# Patient Record
Sex: Male | Born: 1950 | Race: White | Hispanic: No | Marital: Single
Health system: Southern US, Community
[De-identification: ages and names within clinical notes are randomized; demographics above are authoritative.]

---

## 2008-10-29 ENCOUNTER — Ambulatory Visit: Payer: Self-pay | Admitting: Family Medicine

## 2008-10-29 DIAGNOSIS — I10 Essential (primary) hypertension: Secondary | ICD-10-CM

## 2008-10-30 ENCOUNTER — Telehealth (INDEPENDENT_AMBULATORY_CARE_PROVIDER_SITE_OTHER): Payer: Self-pay | Admitting: *Deleted

## 2008-10-30 DIAGNOSIS — E119 Type 2 diabetes mellitus without complications: Secondary | ICD-10-CM

## 2008-10-30 LAB — CONVERTED CEMR LAB
Albumin: 4.5 g/dL (ref 3.5–5.2)
BUN: 11 mg/dL (ref 6–23)
CO2: 24 meq/L (ref 19–32)
Calcium: 9.3 mg/dL (ref 8.4–10.5)
Chloride: 97 meq/L (ref 96–112)
Glucose, Bld: 353 mg/dL — ABNORMAL HIGH (ref 70–99)
HDL: 38 mg/dL — ABNORMAL LOW (ref >39)
Lymphs Abs: 1.5 10*3/uL (ref 0.7–4.0)
MCV: 85.5 fL (ref 78.0–100.0)
Monocytes Relative: 7 % (ref 3–12)
Neutro Abs: 4.1 10*3/uL (ref 1.7–7.7)
Neutrophils Relative %: 66 % (ref 43–77)
Potassium: 4.3 meq/L (ref 3.5–5.3)
RBC: 5.71 M/uL (ref 4.22–5.81)
TSH: 1.778 microintl units/mL (ref 0.350–4.50)
Triglycerides: 165 mg/dL — ABNORMAL HIGH (ref <150)
WBC: 6.3 10*3/uL (ref 4.0–10.5)

## 2008-11-05 ENCOUNTER — Encounter (INDEPENDENT_AMBULATORY_CARE_PROVIDER_SITE_OTHER): Payer: Self-pay | Admitting: Family Medicine

## 2008-11-05 ENCOUNTER — Ambulatory Visit: Payer: Self-pay | Admitting: Family Medicine

## 2008-11-11 ENCOUNTER — Ambulatory Visit: Payer: Self-pay | Admitting: *Deleted

## 2008-11-15 ENCOUNTER — Encounter (INDEPENDENT_AMBULATORY_CARE_PROVIDER_SITE_OTHER): Payer: Self-pay | Admitting: Family Medicine

## 2008-11-21 ENCOUNTER — Ambulatory Visit: Payer: Self-pay | Admitting: Internal Medicine

## 2008-12-09 ENCOUNTER — Telehealth (INDEPENDENT_AMBULATORY_CARE_PROVIDER_SITE_OTHER): Payer: Self-pay | Admitting: Family Medicine

## 2008-12-16 ENCOUNTER — Ambulatory Visit: Payer: Self-pay | Admitting: Family Medicine

## 2008-12-16 LAB — CONVERTED CEMR LAB: Hgb A1c MFr Bld: 8.7 %

## 2008-12-30 ENCOUNTER — Ambulatory Visit: Payer: Self-pay | Admitting: Family Medicine

## 2008-12-30 DIAGNOSIS — E113299 Type 2 diabetes mellitus with mild nonproliferative diabetic retinopathy without macular edema, unspecified eye: Secondary | ICD-10-CM | POA: Insufficient documentation

## 2008-12-30 DIAGNOSIS — E11319 Type 2 diabetes mellitus with unspecified diabetic retinopathy without macular edema: Secondary | ICD-10-CM | POA: Insufficient documentation

## 2008-12-30 DIAGNOSIS — E11311 Type 2 diabetes mellitus with unspecified diabetic retinopathy with macular edema: Secondary | ICD-10-CM | POA: Insufficient documentation

## 2008-12-30 LAB — CONVERTED CEMR LAB
Bilirubin Urine: NEGATIVE
Blood Glucose, Fingerstick: 138
Cholesterol: 187 mg/dL (ref 0–200)
Ketones, urine, test strip: NEGATIVE
Nitrite: NEGATIVE
Total CHOL/HDL Ratio: 5.2
pH: 5

## 2009-01-01 ENCOUNTER — Encounter (INDEPENDENT_AMBULATORY_CARE_PROVIDER_SITE_OTHER): Payer: Self-pay | Admitting: Family Medicine

## 2009-01-06 ENCOUNTER — Encounter (INDEPENDENT_AMBULATORY_CARE_PROVIDER_SITE_OTHER): Payer: Self-pay | Admitting: Family Medicine

## 2009-01-10 ENCOUNTER — Encounter (INDEPENDENT_AMBULATORY_CARE_PROVIDER_SITE_OTHER): Payer: Self-pay | Admitting: Family Medicine

## 2014-12-08 ENCOUNTER — Encounter (HOSPITAL_COMMUNITY): Payer: Self-pay | Admitting: *Deleted

## 2014-12-08 ENCOUNTER — Emergency Department (HOSPITAL_COMMUNITY): Payer: Self-pay

## 2014-12-08 ENCOUNTER — Emergency Department (HOSPITAL_COMMUNITY)
Admission: EM | Admit: 2014-12-08 | Discharge: 2015-01-05 | Disposition: E | Payer: Self-pay | Attending: Emergency Medicine | Admitting: Emergency Medicine

## 2014-12-08 DIAGNOSIS — I469 Cardiac arrest, cause unspecified: Secondary | ICD-10-CM

## 2014-12-08 DIAGNOSIS — Z880 Allergy status to penicillin: Secondary | ICD-10-CM | POA: Insufficient documentation

## 2014-12-08 DIAGNOSIS — R140 Abdominal distension (gaseous): Secondary | ICD-10-CM | POA: Insufficient documentation

## 2014-12-08 LAB — I-STAT TROPONIN, ED: Troponin i, poc: 0.32 ng/mL (ref 0.00–0.08)

## 2014-12-08 LAB — CBC WITH DIFFERENTIAL/PLATELET
Basophils Absolute: 0.1 10*3/uL (ref 0.0–0.1)
Basophils Relative: 1 % (ref 0–1)
EOS PCT: 1 % (ref 0–5)
Eosinophils Absolute: 0.1 10*3/uL (ref 0.0–0.7)
HCT: 39.9 % (ref 39.0–52.0)
HEMOGLOBIN: 12.7 g/dL — AB (ref 13.0–17.0)
Lymphocytes Relative: 33 % (ref 12–46)
Lymphs Abs: 3.5 10*3/uL (ref 0.7–4.0)
MCH: 29.1 pg (ref 26.0–34.0)
MCHC: 31.8 g/dL (ref 30.0–36.0)
MCV: 91.3 fL (ref 78.0–100.0)
MONOS PCT: 7 % (ref 3–12)
Monocytes Absolute: 0.8 10*3/uL (ref 0.1–1.0)
Neutro Abs: 6.2 10*3/uL (ref 1.7–7.7)
Neutrophils Relative %: 58 % (ref 43–77)
Platelets: 109 10*3/uL — ABNORMAL LOW (ref 150–400)
RBC: 4.37 MIL/uL (ref 4.22–5.81)
RDW: 13.6 % (ref 11.5–15.5)
WBC: 10.7 10*3/uL — ABNORMAL HIGH (ref 4.0–10.5)

## 2014-12-08 LAB — I-STAT ARTERIAL BLOOD GAS, ED
ACID-BASE DEFICIT: 19 mmol/L — AB (ref 0.0–2.0)
Bicarbonate: 13 mEq/L — ABNORMAL LOW (ref 20.0–24.0)
O2 SAT: 96 %
PH ART: 6.954 — AB (ref 7.350–7.450)
Patient temperature: 35.8
TCO2: 15 mmol/L (ref 0–100)
pCO2 arterial: 57.5 mmHg (ref 35.0–45.0)
pO2, Arterial: 129 mmHg — ABNORMAL HIGH (ref 80.0–100.0)

## 2014-12-08 LAB — I-STAT CG4 LACTIC ACID, ED: Lactic Acid, Venous: 12.69 mmol/L (ref 0.5–2.0)

## 2014-12-08 LAB — I-STAT CHEM 8, ED
BUN: 35 mg/dL — ABNORMAL HIGH (ref 6–23)
CALCIUM ION: 1.15 mmol/L (ref 1.13–1.30)
Chloride: 103 mmol/L (ref 96–112)
Creatinine, Ser: 2.4 mg/dL — ABNORMAL HIGH (ref 0.50–1.35)
GLUCOSE: 494 mg/dL — AB (ref 70–99)
HCT: 42 % (ref 39.0–52.0)
Hemoglobin: 14.3 g/dL (ref 13.0–17.0)
Potassium: 4.1 mmol/L (ref 3.5–5.1)
Sodium: 137 mmol/L (ref 135–145)
TCO2: 13 mmol/L (ref 0–100)

## 2014-12-08 MED ORDER — EPINEPHRINE HCL 1 MG/ML IJ SOLN
0.5000 ug/min | INTRAVENOUS | Status: DC
  Administered 2014-12-08: 0.5 ug/min via INTRAVENOUS
  Filled 2014-12-08: qty 4

## 2014-12-08 MED ORDER — ROCURONIUM BROMIDE 50 MG/5ML IV SOLN
INTRAVENOUS | Status: AC
  Filled 2014-12-08: qty 2

## 2014-12-08 MED ORDER — SUCCINYLCHOLINE CHLORIDE 20 MG/ML IJ SOLN
INTRAMUSCULAR | Status: AC
  Filled 2014-12-08: qty 1

## 2014-12-08 MED ORDER — ETOMIDATE 2 MG/ML IV SOLN
INTRAVENOUS | Status: AC
  Filled 2014-12-08: qty 20

## 2014-12-08 MED ORDER — EPINEPHRINE HCL 0.1 MG/ML IJ SOSY
PREFILLED_SYRINGE | INTRAMUSCULAR | Status: DC | PRN
Start: 2014-12-08 — End: 2014-12-09
  Administered 2014-12-08 (×2): 1 via INTRAVENOUS
  Administered 2014-12-08: 0.5 mg via INTRAVENOUS

## 2014-12-08 MED ORDER — LIDOCAINE HCL (CARDIAC) 20 MG/ML IV SOLN
INTRAVENOUS | Status: AC
  Filled 2014-12-08: qty 5

## 2014-12-08 MED ORDER — SODIUM BICARBONATE 8.4 % IV SOLN
INTRAVENOUS | Status: DC | PRN
  Administered 2014-12-08: 50 meq via INTRAVENOUS

## 2014-12-08 MED ORDER — ROCURONIUM BROMIDE 50 MG/5ML IV SOLN
INTRAVENOUS | Status: DC | PRN
Start: 2014-12-08 — End: 2014-12-09
  Administered 2014-12-08: 100 mg via INTRAVENOUS

## 2014-12-08 NOTE — ED Notes (Signed)
Pulse check - no pulses. CPR restarted.

## 2014-12-08 NOTE — ED Notes (Signed)
Pulse check- pulses returned 

## 2014-12-08 NOTE — Code Documentation (Signed)
Pulses lost. 

## 2014-12-08 NOTE — ED Notes (Signed)
Dr Wilkie AyeHorton given a copy of troponin results .32

## 2014-12-08 NOTE — ED Notes (Signed)
Pulse check - pulses have returned.

## 2014-12-08 NOTE — ED Notes (Signed)
Cooling stopped her Dr Eliberto IvoryAustin (critical care).

## 2014-12-08 NOTE — ED Notes (Signed)
Dr Wilkie AyeHorton given a copy of lactic acid result 12.69

## 2014-12-08 NOTE — ED Notes (Signed)
Pt arrives EMS from parking lot. Pt was found by bystander. EMS found pt to be pulseless (asystole) with apneic breathing. EMS began CPR. Pt received total of 8 epis and 1 amp D50. Pulses returned and pt was SVT rate 160. Pt became pulseless again en route and CPR was restarted. 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 Cold saline initiated by EMS.

## 2014-12-08 NOTE — ED Provider Notes (Signed)
INTUBATION Performed by: Alexander LeitzNickle, Alexander Reed  Required items: required blood products, implants, devices, and special equipment available Patient identity confirmed: provided demographic data and hospital-assigned identification number Time out: Not called due to emergent procedure  Indications: respiratory failure, cardiac arrest  Intubation method: Direct Laryngoscopy, Mac 4  Preoxygenation: Alexander DareKing Airway  Sedatives: none - GCS 3, in cardiac arrest Paralytic: Roccuronium  Tube Size: 7.5 cuffed  Post-procedure assessment: chest rise and ETCO2 monitor Breath sounds: equal and absent over the epigastrium Tube secured with: ETT holder Chest x-ray interpreted by radiologist and me.  Chest x-ray findings: endotracheal tube in appropriate position  Patient tolerated the procedure well with no immediate complications.   Alexander LeitzAlex Leathia Farnell, MD 26-Oct-2014 [id number]  Alexander Batonourtney F Horton, MD 12/12/2014 [phone]

## 2014-12-08 NOTE — Procedures (Deleted)
INTUBATION Performed by: Dorna LeitzNickle, Dovie Kapusta  Required items: required blood products, implants, devices, and special equipment available Patient identity confirmed: provided demographic data and hospital-assigned identification number Time out: Not called due to emergent procedure  Indications: respiratory failure, cardiac arrest  Intubation method: Direct Laryngoscopy, Mac 4  Preoxygenation: Brooke DareKing Airway  Sedatives: none - GCS 3, in cardiac arrest Paralytic: Roccuronium  Tube Size: 7.5 cuffed  Post-procedure assessment: chest rise and ETCO2 monitor Breath sounds: equal and absent over the epigastrium Tube secured with: ETT holder Chest x-ray interpreted by radiologist and me.  Chest x-ray findings: endotracheal tube in appropriate position  Patient tolerated the procedure well with no immediate complications.

## 2014-12-08 NOTE — Consult Note (Signed)
PULMONARY  / CRITICAL CARE MEDICINE CONSULTATION   Name: Alexander Reed MRN: [id number] DOB: [birth year]    ADMISSION DATE:  Dec 09, 2014 CONSULTATION DATE: 01/03/2015  REQUESTING CLINICIAN: Dr. Durward Fortes PRIMARY SERVICE: E\D  CHIEF COMPLAINT:  Cardiac Arrest  BRIEF PATIENT DESCRIPTION: 63yom with PMH of DM, presents after being found down in the field.  ROSC after about 25 min.  Initial rhythm PEA.    SIGNIFICANT EVENTS / STUDIES:  December 09, 2014:  Presented to ED after arrest in field.  Arrested again x 2 in ED with ROSC, CXR with diffuse bilateral infiltrates  LINES / TUBES: ETT:  09-Dec-2014--> Foley:  12/22/2014-->  CULTURES: None  ANTIBIOTICS: None  HISTORY OF PRESENT ILLNESS:  63yom with PMH of DM, presents after being found down in the field.  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  Unclear how long he had been down.  CPR was initiated in the field with ROSC after about 25 min and 8 rounds of epi.  Pt coded again in the ED with ROSC after 2 rounds epi.  Cooling was initiated but d/c'ed per me given long down time.  Pt also with difficulty oxygenating and recurrent hypotension requiring epi gtt.  I contacted his brother via phone and updated him.  I expressed my concerns about his neuro status given unknown down time and persistent instability.  His brother stated that the pt would not want aggressive measures per prior expressed wishes.  Therefore, pt was made DNR and then terminally extubated per his family wishes.    PAST MEDICAL HISTORY :  History reviewed. No pertinent past medical history. History reviewed. No pertinent past surgical history. Prior to Admission medications   Not on File   Allergies  Allergen Reactions  . Penicillins     FAMILY HISTORY:  Unable to obtain 2/2 AMS  SOCIAL HISTORY: No h/o smoking  REVIEW OF SYSTEMS: Unable to obtain 2/2 AMS  SUBJECTIVE:  Unable to obtain 2/2 AMS  VITAL SIGNS: Temp:  [95.2 F (35.1 C)-98.4 F (36.9 C)] 98.4  F (36.9 C) (04/03 2352) Pulse Rate:  [95-155] 101 (04/03 2352) Resp:  [12-24] 22 (04/03 2352) BP: (67-182)/(44-131) 71/48 mmHg (04/03 2352) SpO2:  [77 %-95 %] 92 % (04/03 2352) FiO2 (%):  [100 %] 100 % (04/03 2317) HEMODYNAMICS:   VENTILATOR SETTINGS: Vent Mode:  [-]  FiO2 (%):  [100 %] 100 % INTAKE / OUTPUT: Intake/Output      04/03 0701 - 04/04 0700   Urine 200   Total Output 200   Net -200         PHYSICAL EXAMINATION: General:  Intubated, nonresponsive Neuro:  Pupils fixed, dilated, arreflexic HEENT:  ETT in place Cardiovascular:  Tachy, no murmurs Lungs:  Diffusely wheezy and coarse, poor air movement Abdomen:  Absent BS Musculoskeletal:  No edema Skin:  Cool extremities  LABS:  CBC  Recent Labs Lab 12/07/2014 2309 12/07/2014 2319  WBC 10.7*  --   HGB 12.7* 14.3  HCT 39.9 42.0  PLT 109*  --    Coag's No results for input(s): APTT, INR in the last 168 hours. BMET  Recent Labs Lab 12/17/2014 2319  NA 137  K 4.1  CL 103  BUN 35*  CREATININE 2.40*  GLUCOSE 494*   Electrolytes No results for input(s): CALCIUM, MG, PHOS in the last 168 hours. Sepsis Markers  Recent Labs Lab 12/17/2014 2321  LATICACIDVEN 12.69*   ABG  Recent Labs Lab 12/19/2014 2345  PHART 6.954*  PCO2ART 57.5*  PO2ART 129.0*   Liver Enzymes No results for input(s): AST, ALT, ALKPHOS, BILITOT, ALBUMIN in the last 168 hours. Cardiac Enzymes No results for input(s): TROPONINI, PROBNP in the last 168 hours. Glucose No results for input(s): GLUCAP in the last 168 hours.  Imaging No results found.  EKG: Tachy, afib, RBB, no ST elevation/ depression CXR: Diffuse bilateral patchy infiltrates  ASSESSMENT / PLAN:  63yom with PMH of DM, presents after being found down in the field with prolonged CPR.  Neuro exam was concerning and pH was <7.00 with mixed metabolic and resp acidosis.  While in the ED, pt was requiring intermittent epi boluses to maintain BP.  Per discussion with  family both via phone and at bedside, decision was made to make pt comfort measures only.  He was terminally extubated in the ED.    TODAY'S SUMMARY: Presented to ED after being found down in PEA in the field.  >30 total CPR.  Concerning neuro exam.  Made comfort measure by family and terminally extubated in ED.    I have personally obtained a history, examined the patient, evaluated laboratory and imaging results, formulated the assessment and plan and placed orders.  CRITICAL CARE: The patient is critically ill with multiple organ systems failure and requires high complexity decision making for assessment and support, frequent evaluation and titration of therapies, application of advanced monitoring technologies and extensive interpretation of multiple databases. Critical Care Time devoted to patient care services described in this note is 50 minutes.   Joen Laura. Adrian Kavian Peters, MD Pulmonary and Critical Care Medicine [facility] Pager: [phone]   12/29/2014, 11:58 PM

## 2014-12-08 NOTE — ED Notes (Signed)
Cold Saline initiated.

## 2014-12-08 NOTE — ED Notes (Signed)
Dr Eliberto IvoryAustin (critical care) at bedside.

## 2014-12-09 ENCOUNTER — Telehealth: Payer: Self-pay

## 2014-12-09 LAB — SAMPLE TO BLOOD BANK

## 2014-12-09 LAB — COMPREHENSIVE METABOLIC PANEL
ALK PHOS: 102 U/L (ref 39–117)
ALT: 272 U/L — ABNORMAL HIGH (ref 0–53)
AST: 268 U/L — AB (ref 0–37)
Albumin: 3.3 g/dL — ABNORMAL LOW (ref 3.5–5.2)
Anion gap: 21 — ABNORMAL HIGH (ref 5–15)
BILIRUBIN TOTAL: 0.4 mg/dL (ref 0.3–1.2)
BUN: 27 mg/dL — ABNORMAL HIGH (ref 6–23)
CHLORIDE: 101 mmol/L (ref 96–112)
CO2: 14 mmol/L — AB (ref 19–32)
CREATININE: 2.92 mg/dL — AB (ref 0.50–1.35)
Calcium: 7.9 mg/dL — ABNORMAL LOW (ref 8.4–10.5)
GFR calc Af Amer: 25 mL/min — ABNORMAL LOW (ref >90)
GFR calc non Af Amer: 21 mL/min — ABNORMAL LOW (ref >90)
Glucose, Bld: 504 mg/dL — ABNORMAL HIGH (ref 70–99)
Potassium: 3.9 mmol/L (ref 3.5–5.1)
Sodium: 136 mmol/L (ref 135–145)
Total Protein: 5.8 g/dL — ABNORMAL LOW (ref 6.0–8.3)

## 2014-12-09 LAB — URINALYSIS, ROUTINE W REFLEX MICROSCOPIC
BILIRUBIN URINE: NEGATIVE
Glucose, UA: 100 mg/dL — AB
Ketones, ur: NEGATIVE mg/dL
LEUKOCYTES UA: NEGATIVE
NITRITE: NEGATIVE
Protein, ur: 100 mg/dL — AB
SPECIFIC GRAVITY, URINE: 1.02 (ref 1.005–1.030)
UROBILINOGEN UA: 0.2 mg/dL (ref 0.0–1.0)
pH: 5 (ref 5.0–8.0)

## 2014-12-09 LAB — URINE MICROSCOPIC-ADD ON

## 2014-12-09 MED FILL — Medication: Qty: 1 | Status: AC

## 2015-01-05 NOTE — ED Notes (Addendum)
Pt in asystole, skin pale, dry, no respirations noted. Brother at bedside. EDP informed and Dr. Eliberto IvoryAustin paged.

## 2015-01-05 NOTE — Procedures (Signed)
Extubation Procedure Note  Patient Details:   Name: Lurlean LeydenDavid Orbach DOB: 1951-07-29 MRN: [id number]   Airway Documentation:     Evaluation  O2 sats: transiently fell during during procedure Complications: No apparent complications Patient did tolerate procedure well. Bilateral Breath Sounds: Coarse crackles Suctioning: Airway, Oral No  Patient terminally extubated to room air. No respiratory effort noted post Extubation.   Erskine SpeedMike Jr, [place]ondo Holycross William 12/26/2014, 12:47 AM

## 2015-01-05 NOTE — ED Provider Notes (Signed)
CSN: [id number]     Arrival date & time 12/11/2014  2305 History   First MD Initiated Contact with Patient 12/14/2014 2327     No chief complaint on file.    (Consider location/radiation/quality/duration/timing/severity/associated sxs/prior Treatment) HPI  Level 5 Caveat  This is a 64 year old male who presents in cardiac arrest. Per EMS, patient was found down pulseless. Initial rhythm was asystole. CPR was initiated. Patient was given 80 g of epinephrine and EMS achieved Rosc.  However, approximately 5 minutes prior to arrival, they lost pulses again. CPR in progress upon arrival. Per EMS, patient was in a tachyarrhythmia with ROSC.  Only known history of diabetes. Patient given D50 and glucose 450.  CPR was continued in the trauma Bay. Patient was given 1 mg of epinephrine. King airway in place and oxygenating in the low 90s.  Achieved ROS see after 1 mg of epinephrine. Patient appears to be an atrial flutter rhythm on the monitor. King airway was replaced with an ET tube. Patient was intubated without sedative but was given paralytic given stiffness of his jaw.  Patient lost pulses again and after approximately 2 minutes of CPR and 1 mg of epinephrine, patient regained pulses. Epinephrine drip initiated.  Patient cooled per protocol.  History reviewed. No pertinent past medical history. History reviewed. No pertinent past surgical history. No family history on file. History  Substance Use Topics  . Smoking status: Not on file  . Smokeless tobacco: Not on file  . Alcohol Use: Not on file    Review of Systems  Unable to perform ROS: Acuity of condition      Allergies  Penicillins  Home Medications   Prior to Admission medications   Not on File   BP 135/64 mmHg  Pulse 129  Temp(Src) 98.6 F (37 C)  Resp 22  Ht  (1.803 m)  SpO2 97% Physical Exam  Constitutional:  GCS 3, King airway in place  HENT:  Head: Normocephalic and atraumatic.  Eyes:  Pupils 6 mm  nonreactive bilaterally  Neck: Neck supple.  Cardiovascular:  Pulseless, CPR in progress  Pulmonary/Chest:  Coarse bilateral breath sounds with bagging, no spontaneous respiratory effort  Abdominal: Soft. He exhibits distension.  Musculoskeletal: He exhibits no edema.  Neurological:  GCS 3  Skin: Skin is warm and dry.  Nursing note and vitals reviewed.   ED Course  Procedures (including critical care time)  CRITICAL CARE Performed by: Shon Baton   Total critical care time: 45 min  Critical care time was exclusive of separately billable procedures and treating other patients.  Critical care was necessary to treat or prevent imminent or life-threatening deterioration.  Critical care was time spent personally by me on the following activities: development of treatment plan with patient and/or surrogate as well as nursing, discussions with consultants, evaluation of patient's response to treatment, examination of patient, obtaining history from patient or surrogate, ordering and performing treatments and interventions, ordering and review of laboratory studies, ordering and review of radiographic studies, pulse oximetry and re-evaluation of patient's condition.  Cardiopulmonary Resuscitation (CPR) Procedure Note Directed/Performed by: Shon Baton I personally directed ancillary staff and/or performed CPR in an effort to regain return of spontaneous circulation and to maintain cardiac, neuro and systemic perfusion.   INTUBATION Performed by: Resident, supervised by Shon Baton  Required items: required blood products, implants, devices, and special equipment available Patient identity confirmed: provided demographic data and hospital-assigned identification number Time out: Immediately prior to procedure a "  time out" was called to verify the correct patient, procedure, equipment, support staff and site/side marked as required.  Indications: Cardiac  arrest  Intubation method: Direct laryngoscopy   Preoxygenation: Brooke DareKing  Sedatives: none Paralytic: Rocuronium  Tube Size: 8-0 cuffed  Post-procedure assessment: chest rise and ETCO2 monitor Breath sounds: equal and absent over the epigastrium Tube secured with: ETT holder Chest x-ray interpreted by radiologist and me.  Chest x-ray findings: endotracheal tube in appropriate position  Patient tolerated the procedure well with no immediate complications.    Labs Review Labs Reviewed  CBC WITH DIFFERENTIAL/PLATELET - Abnormal; Notable for the following:    WBC 10.7 (*)    Hemoglobin 12.7 (*)    Platelets 109 (*)    All other components within normal limits  I-STAT CHEM 8, ED - Abnormal; Notable for the following:    BUN 35 (*)    Creatinine, Ser 2.40 (*)    Glucose, Bld 494 (*)    All other components within normal limits  I-STAT TROPOININ, ED - Abnormal; Notable for the following:    Troponin i, poc 0.32 (*)    All other components within normal limits  I-STAT CG4 LACTIC ACID, ED - Abnormal; Notable for the following:    Lactic Acid, Venous 12.69 (*)    All other components within normal limits  I-STAT ARTERIAL BLOOD GAS, ED - Abnormal; Notable for the following:    pH, Arterial 6.954 (*)    pCO2 arterial 57.5 (*)    pO2, Arterial 129.0 (*)    Bicarbonate 13.0 (*)    Acid-base deficit 19.0 (*)    All other components within normal limits  COMPREHENSIVE METABOLIC PANEL  URINALYSIS, ROUTINE W REFLEX MICROSCOPIC  SAMPLE TO BLOOD BANK    Imaging Review No results found.   EKG Interpretation None      MDM   Final diagnoses:  Cardiac arrest    Patient presents in cardiac arrest. Pulses regained after 1 mg of epi but lost again. Achieved Rosc.  Patient placed on epinephrine drip. Critical care consulted. Cooling initiated; however, feel patient's prognosis is poor given pupillary exam and neurologic status. Critical care at the bedside. DC cooling per critical  care.    Shon Batonourtney F Jennife Zaucha, MD 02-14-2015 [phone]

## 2015-01-05 NOTE — ED Notes (Signed)
Pts brother wishes for all drips and ventilation to be stopped.

## 2015-01-05 NOTE — ED Notes (Signed)
Pt extubated - brother at bedside.

## 2015-01-05 NOTE — Progress Notes (Signed)
Terminally Extubated Patient to Room air Per Md Order

## 2015-01-05 NOTE — Telephone Encounter (Signed)
Received death certificate from [facility] 12/17/2014.  Paged Dr. Craige CottaSood for input on who is to sign.  He said to send to Dr. Vassie LollAlva as he was rounding over the weekend.  Sending to [employer] via courier this afternoon for signature.  Received back and called for pick-up 12/10/14.

## 2015-01-05 NOTE — ED Notes (Signed)
pts brother at bedside with Dr Eliberto IvoryAustin

## 2015-01-05 DEATH — deceased

## 2015-10-08 IMAGING — CR DG CHEST 1V PORT
1 series · 1 of 1 positions shown · non-contrast
Comparison: None.

CLINICAL DATA: CPR.  Intubated.

EXAM:
PORTABLE CHEST - 1 VIEW

[AP]
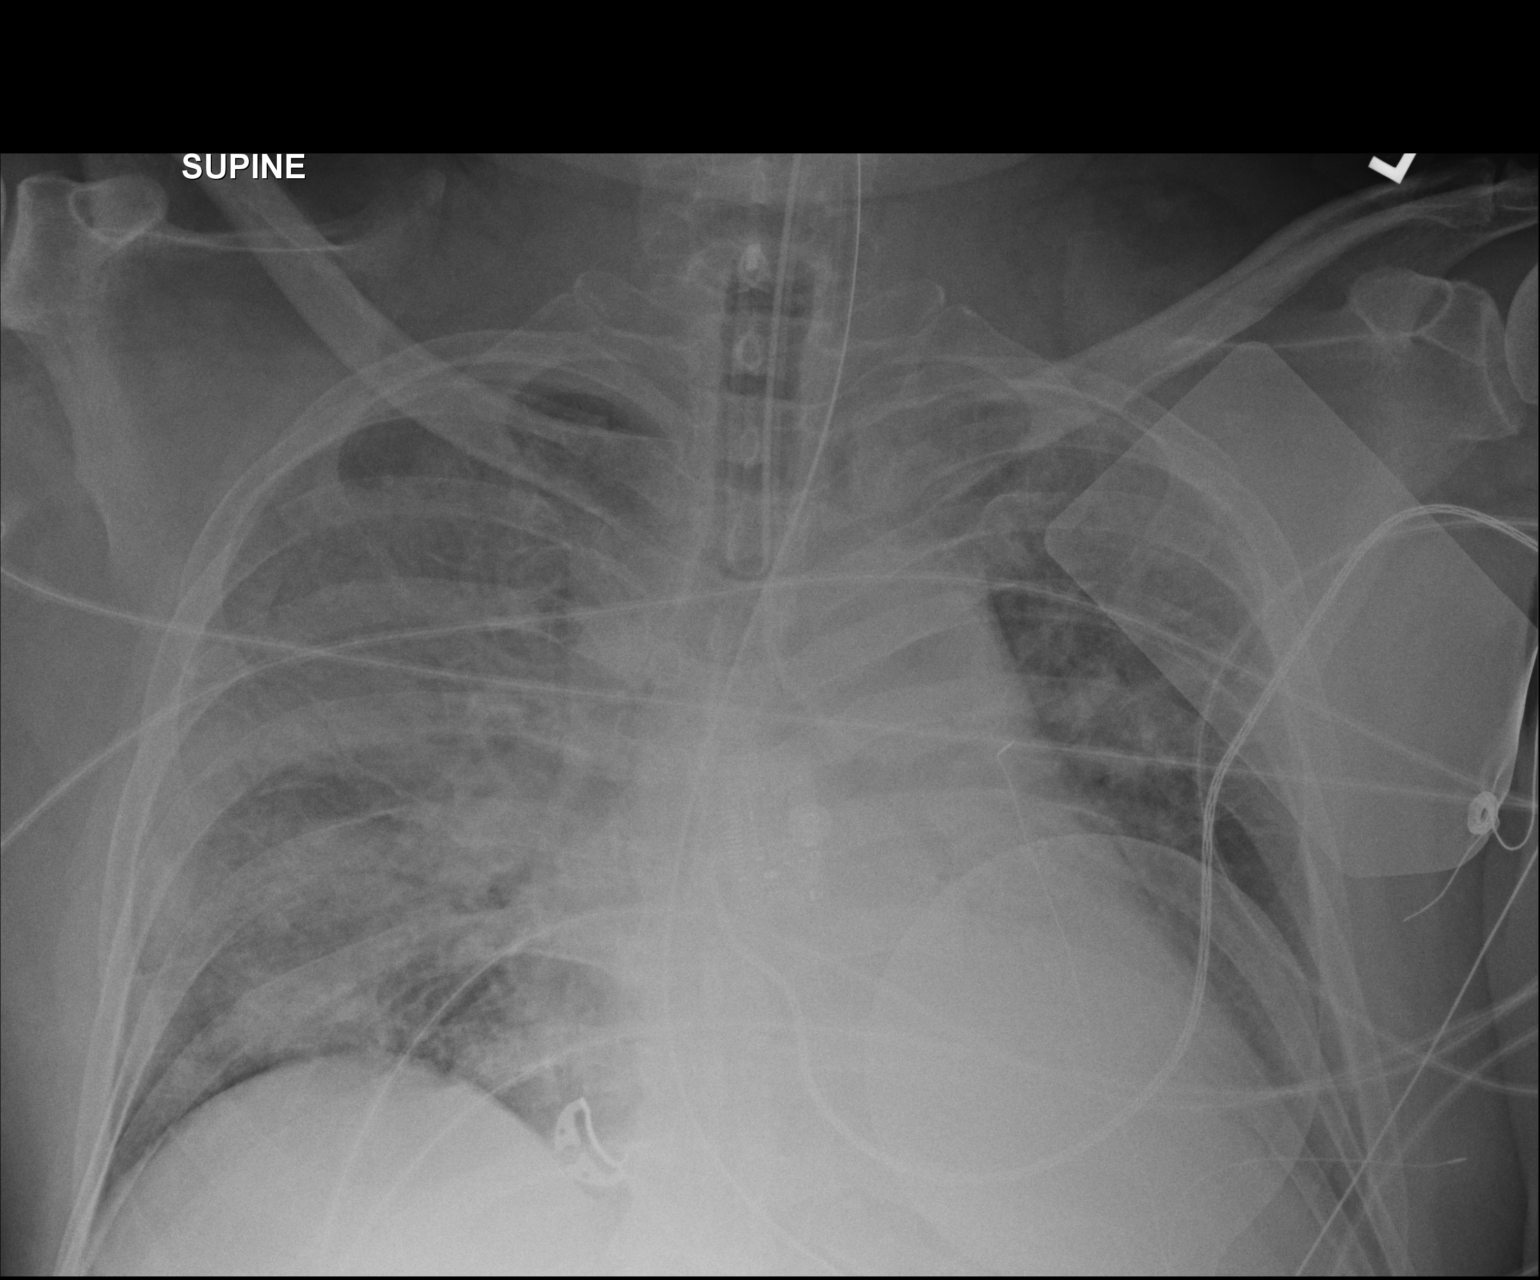

[1 of 1 positions shown; findings below may reference images not displayed]

FINDINGS: Endotracheal tube tip is between the clavicular heads and carina.
The orogastric tube reaches the stomach at least.

There is widespread lung opacity consistent with airspace disease.
Cardiopericardial enlargement, accentuated by low volumes and supine
positioning. Limited mediastinal evaluation due to distortion from
low volumes. No evidence of rib fracture or air leak.
IMPRESSION: 1. Endotracheal and orogastric tubes are in good position.
2. Edema or widespread aspiration.
3. Cardiopericardial enlargement.
